# Patient Record
Sex: Male | Born: 1985 | Race: White | Hispanic: No | Marital: Married | Smoking: Former smoker
Health system: Southern US, Community
[De-identification: ages and names within clinical notes are randomized; demographics above are authoritative.]

## PROBLEM LIST (undated history)

## (undated) DIAGNOSIS — F419 Anxiety disorder, unspecified: Secondary | ICD-10-CM

## (undated) DIAGNOSIS — K219 Gastro-esophageal reflux disease without esophagitis: Secondary | ICD-10-CM

## (undated) DIAGNOSIS — I10 Essential (primary) hypertension: Secondary | ICD-10-CM

## (undated) HISTORY — PX: HERNIA REPAIR: SHX51

---

## 2017-03-05 ENCOUNTER — Emergency Department
Admission: EM | Admit: 2017-03-05 | Discharge: 2017-03-05 | Disposition: A | Discharge status: Home / Self Care | Payer: Self-pay | Attending: Emergency Medicine | Admitting: Emergency Medicine

## 2017-03-05 ENCOUNTER — Emergency Department: Payer: Self-pay

## 2017-03-05 DIAGNOSIS — I10 Essential (primary) hypertension: Secondary | ICD-10-CM | POA: Insufficient documentation

## 2017-03-05 DIAGNOSIS — F419 Anxiety disorder, unspecified: Secondary | ICD-10-CM | POA: Insufficient documentation

## 2017-03-05 DIAGNOSIS — Z79899 Other long term (current) drug therapy: Secondary | ICD-10-CM | POA: Insufficient documentation

## 2017-03-05 DIAGNOSIS — Z87891 Personal history of nicotine dependence: Secondary | ICD-10-CM | POA: Insufficient documentation

## 2017-03-05 DIAGNOSIS — E876 Hypokalemia: Secondary | ICD-10-CM | POA: Insufficient documentation

## 2017-03-05 HISTORY — DX: Gastro-esophageal reflux disease without esophagitis: K21.9

## 2017-03-05 HISTORY — DX: Essential (primary) hypertension: I10

## 2017-03-05 HISTORY — DX: Anxiety disorder, unspecified: F41.9

## 2017-03-05 LAB — CBC
HCT: 36.1 % — ABNORMAL LOW (ref 40.0–52.0)
HEMOGLOBIN: 12.8 g/dL — AB (ref 13.0–18.0)
MCH: 30.2 pg (ref 26.0–34.0)
MCHC: 35.5 g/dL (ref 32.0–36.0)
MCV: 85.1 fL (ref 80.0–100.0)
Platelets: 360 10*3/uL (ref 150–440)
RBC: 4.25 MIL/uL — AB (ref 4.40–5.90)
RDW: 12.9 % (ref 11.5–14.5)
WBC: 10 10*3/uL (ref 3.8–10.6)

## 2017-03-05 LAB — BASIC METABOLIC PANEL
ANION GAP: 9 (ref 5–15)
BUN: 9 mg/dL (ref 6–20)
CALCIUM: 9.1 mg/dL (ref 8.9–10.3)
CO2: 19 mmol/L — AB (ref 22–32)
Chloride: 111 mmol/L (ref 101–111)
Creatinine, Ser: 1.05 mg/dL (ref 0.61–1.24)
GLUCOSE: 95 mg/dL (ref 65–99)
Potassium: 2.8 mmol/L — ABNORMAL LOW (ref 3.5–5.1)
Sodium: 139 mmol/L (ref 135–145)

## 2017-03-05 LAB — FIBRIN DERIVATIVES D-DIMER (ARMC ONLY): FIBRIN DERIVATIVES D-DIMER (ARMC): 250.33 (ref 0.00–499.00)

## 2017-03-05 LAB — TROPONIN I

## 2017-03-05 MED ORDER — LORAZEPAM 1 MG PO TABS: 1.0000 mg | ORAL_TABLET | Freq: Once | ORAL | Status: DC

## 2017-03-05 MED ORDER — POTASSIUM CHLORIDE CRYS ER 20 MEQ PO TBCR
40.0000 meq | EXTENDED_RELEASE_TABLET | Freq: Once | ORAL | Status: AC
  Administered 2017-03-05: 40 meq via ORAL
  Filled 2017-03-05: qty 2

## 2017-03-05 NOTE — ED Triage Notes (Signed)
Patient reports general malaise beginning at 1pm on 03/04/17. Patient reports recent diagnosis of anxiety. Pt c/o hypertension.   Patient has visible work of breathing, which patient reports is normal for him

## 2017-03-05 NOTE — Discharge Instructions (Signed)
Keep a log of your blood pressure readings morning, noon and evening. Take these to your doctor next week. Continue lisinopril as directed by your doctor. Return to the ER for worsening symptoms, persistent vomiting, difficulty breathing or other concerns.

## 2017-03-05 NOTE — ED Notes (Signed)
Patient transported to X-ray 

## 2017-03-05 NOTE — ED Provider Notes (Signed)
[facility] Emergency Department Provider Note   ____________________________________________   First MD Initiated Contact with Patient 03/05/17 0125     (approximate)  I have reviewed the triage vital signs and the nursing notes.   HISTORY  Chief Complaint Hypertension    HPI Gabriel Ryan is a 31 y.o. male brought to the ED via EMS with a chief complaint of anxiety and hypertension. 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 Taking BuSpar. History of hypertension, taking lisinopril 40 mg daily. Reports generalized malaise 1 day. Repeatedly took his blood pressure and states it is high. Baseline blood pressure 130s/60s. Denies associated headache, chest pain, shortness of breath, abdominal pain, nausea, vomiting, diarrhea. Patient appears to have visible work of breathing which he states has been ongoing since being diagnosed with anxiety last week. He does not feel short of breath and his breathing does not bother him.   Past Medical History:  Diagnosis Date  . Anxiety   . GERD (gastroesophageal reflux disease)   . Hypertension     There are no active problems to display for this patient.   Past Surgical History:  Procedure Laterality Date  . HERNIA REPAIR      Prior to Admission medications   Medication Sig Start Date End Date Taking? Authorizing Provider  busPIRone (BUSPAR) 15 MG tablet Take 15 mg by mouth 3 (three) times daily.   Yes Historical Provider, MD  lisinopril (PRINIVIL,ZESTRIL) 40 MG tablet Take 40 mg by mouth daily.   Yes Historical Provider, MD  metoprolol succinate (TOPROL-XL) 25 MG 24 hr tablet Take 25 mg by mouth daily.   Yes Historical Provider, MD  ranitidine (ZANTAC) 150 MG tablet Take 150 mg by mouth 2 (two) times daily.   Yes Historical Provider, MD    Allergies Patient has no known allergies.  Family History  Problem Relation Age of Onset  . Anxiety  disorder Mother   . Hypertension Father   . Diabetes Father   . Hypertension Sister     Social History Social History  Substance Use Topics  . Smoking status: Former Games developer  . Smokeless tobacco: Never Used  . Alcohol use No    Review of Systems  Constitutional: Positive for generalized malaise. No fever/chills. Eyes: No visual changes. ENT: No sore throat. Cardiovascular: Denies chest pain. Respiratory: Denies shortness of breath. Gastrointestinal: No abdominal pain.  No nausea, no vomiting.  No diarrhea.  No constipation. Genitourinary: Negative for dysuria. Musculoskeletal: Negative for back pain. Skin: Negative for rash. Neurological: Negative for headaches, focal weakness or numbness. Psychiatric:Positive for anxiety.  10-point ROS otherwise negative.  ____________________________________________   PHYSICAL EXAM:  VITAL SIGNS: ED Triage Vitals  Enc Vitals Group     BP 03/05/17 0116 (!) 162/98     Pulse Rate 03/05/17 0116 82     Resp 03/05/17 0116 20     Temp 03/05/17 0116 98 F (36.7 C)     Temp Source 03/05/17 0116 Oral     SpO2 03/05/17 0115 100 %     Weight 03/05/17 0116 245 lb (111.1 kg)     Height 03/05/17 0116  (1.727 m)     Head Circumference --      Peak Flow --      Pain Score --      Pain Loc --      Pain Edu? --      Excl. in GC? --  Constitutional: Alert and oriented. Well appearing and in no acute distress. Eyes: Conjunctivae are normal. PERRL. EOMI. Head: Atraumatic. Nose: No congestion/rhinnorhea. Mouth/Throat: Mucous membranes are moist.  Oropharynx non-erythematous. Neck: No stridor.   Cardiovascular: Normal rate, regular rhythm. Grossly normal heart sounds.  Good peripheral circulation. Respiratory: Normal respiratory effort.  No retractions. Lungs CTAB. Breathing appears labored. Gastrointestinal: Soft and nontender. No distention. No abdominal bruits. No CVA tenderness. Musculoskeletal: No lower extremity tenderness nor  edema.  No joint effusions. Neurologic:  Normal speech and language. No gross focal neurologic deficits are appreciated. No gait instability. Skin:  Skin is warm, dry and intact. No rash noted. Psychiatric: Mood and affect are normal. Speech and behavior are normal.  ____________________________________________   LABS (all labs ordered are listed, but only abnormal results are displayed)  Labs Reviewed  BASIC METABOLIC PANEL - Abnormal; Notable for the following:       Result Value   Potassium 2.8 (*)    CO2 19 (*)    All other components within normal limits  CBC - Abnormal; Notable for the following:    RBC 4.25 (*)    Hemoglobin 12.8 (*)    HCT 36.1 (*)    All other components within normal limits  TROPONIN I  FIBRIN DERIVATIVES D-DIMER ([facility] ONLY)   ____________________________________________  EKG  ED ECG REPORT I, Zailah Zagami J, the attending physician, personally viewed and interpreted this ECG.   Date: 03/05/2017  EKG Time: 0123  Rate: 79  Rhythm: normal EKG, normal sinus rhythm  Axis: Normal  Intervals:none  ST&T Change: Nonspecific  ____________________________________________  RADIOLOGY  Chest x-ray interpreted per Dr. Harrie Jeans: No active cardiopulmonary disease. ____________________________________________   PROCEDURES  Procedure(s) performed: None  Procedures  Critical Care performed: No  ____________________________________________   INITIAL IMPRESSION / ASSESSMENT AND PLAN / ED COURSE  Pertinent labs & imaging results that were available during my care of the patient were reviewed by me and considered in my medical decision making (see chart for details).  31 year old male who presents with elevated blood pressure. Recently diagnosed with anxiety. Blood pressure currently 130s/90s. Discussed with patient we will monitor blood pressure for now as he is asymptomatic. 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  Clinical Course as of Mar 05 617  Fri Mar 05, 2017  8119 Updated patient of laboratory and imaging results. Blood pressure 132/91 prior to my arrival to patient's treatment room. After our conversation his blood pressure is elevated and he is anxious. Patient will be returning home tomorrow. He is not driving for the next several hours and is requesting something for anxiety. Will give  PO Ativan now. Strict return precautions given. Patient verbalizes understanding and agrees with plan of care.  [JS]  0617 Ativan was not given because patient told the nurse that he needs to start driving at 1:47 AM to return home.  [JS]    Clinical Course User Index [JS] Irean Hong, MD     ____________________________________________   FINAL CLINICAL IMPRESSION(S) / ED DIAGNOSES  Final diagnoses:  Essential hypertension  Anxiety  Hypokalemia      NEW MEDICATIONS STARTED DURING THIS VISIT:  Discharge Medication List as of 03/05/2017  4:09 AM       Note:  This document was prepared using Dragon voice recognition software and may include unintentional dictation errors.    Irean Hong, MD 03/05/17 [phone]

## 2018-01-20 IMAGING — CR DG CHEST 2V
2 series · 2 of 2 positions shown · non-contrast
Comparison: None.

CLINICAL DATA: 30 y/o  M; hypertension.

EXAM:
CHEST  2 VIEW

[chest pa]
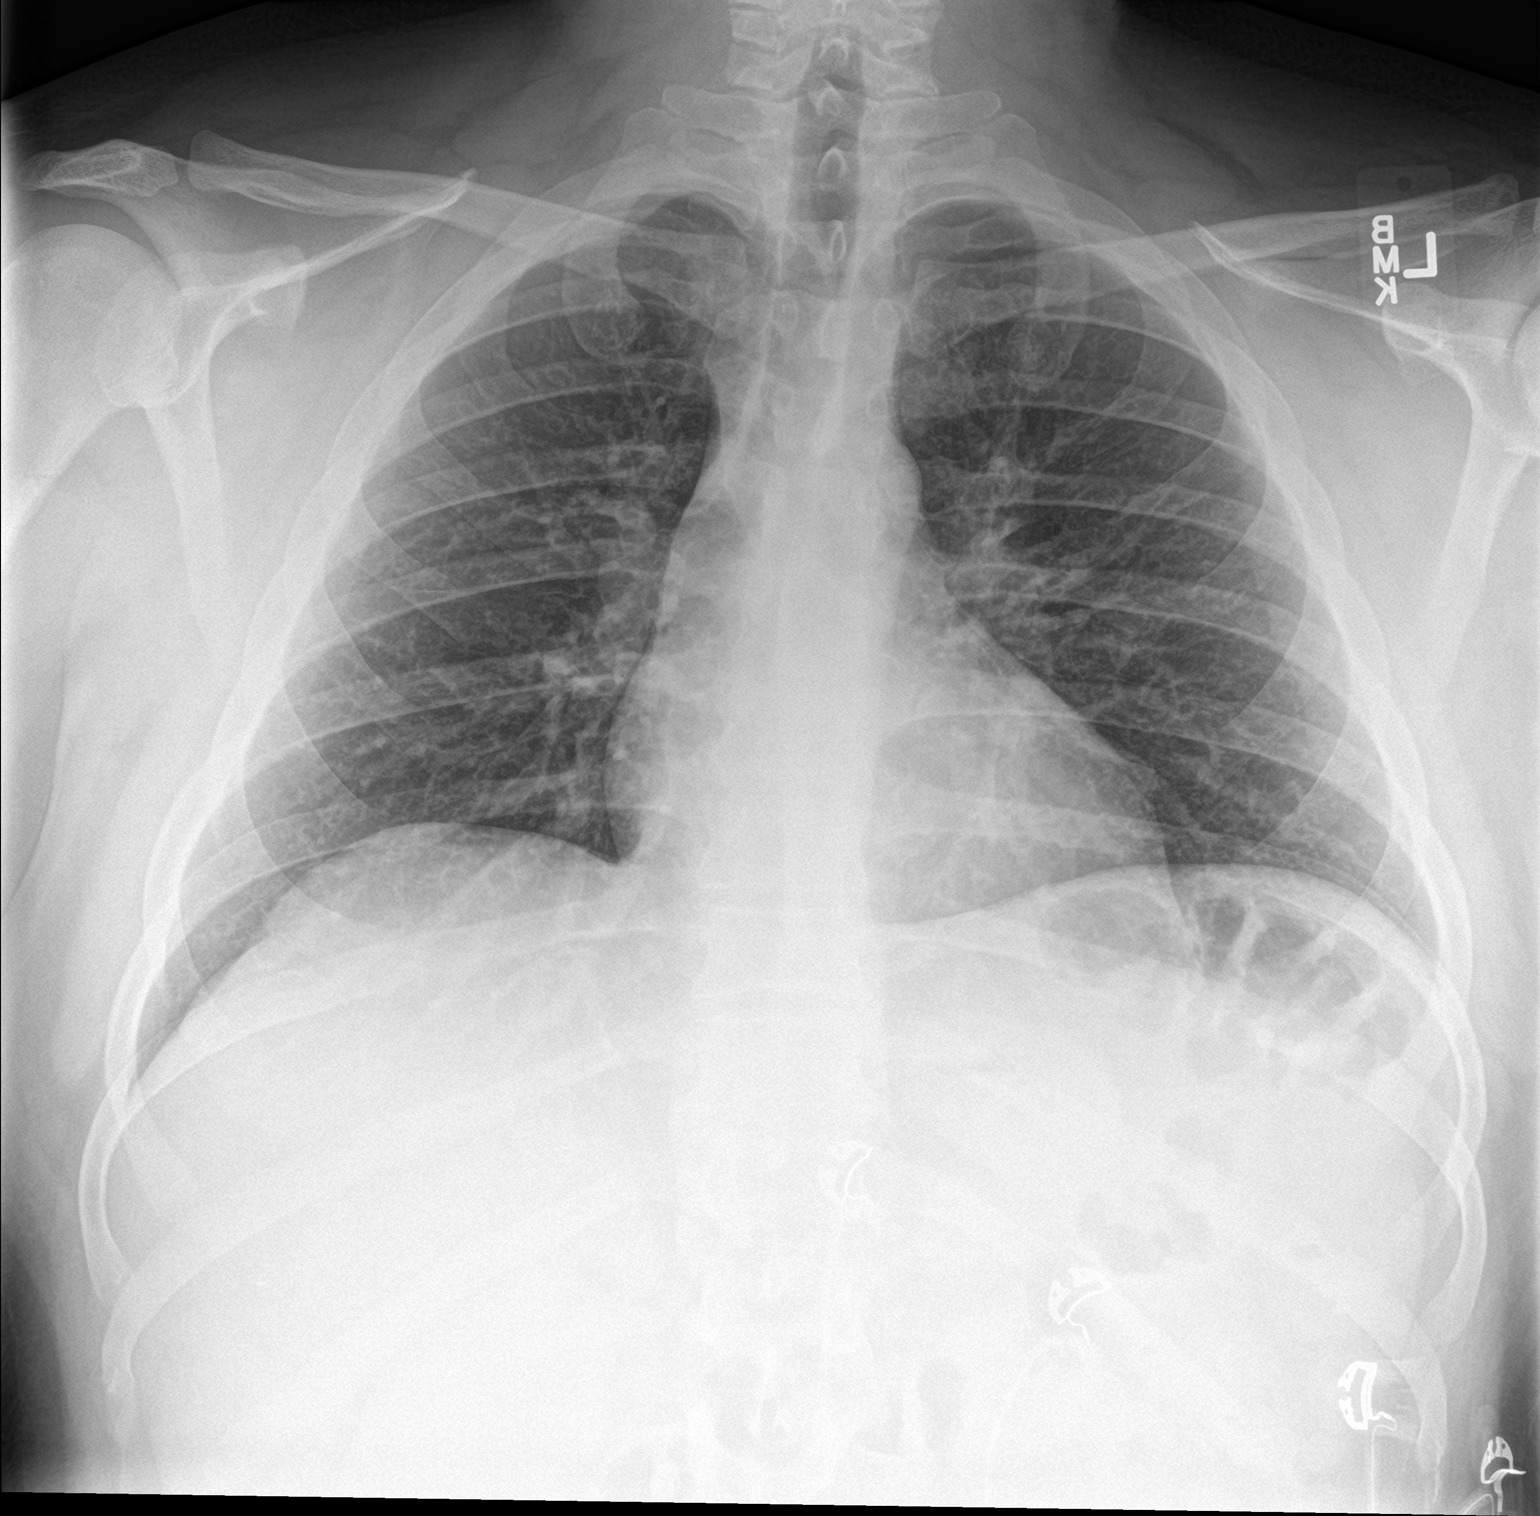

[chest lat]
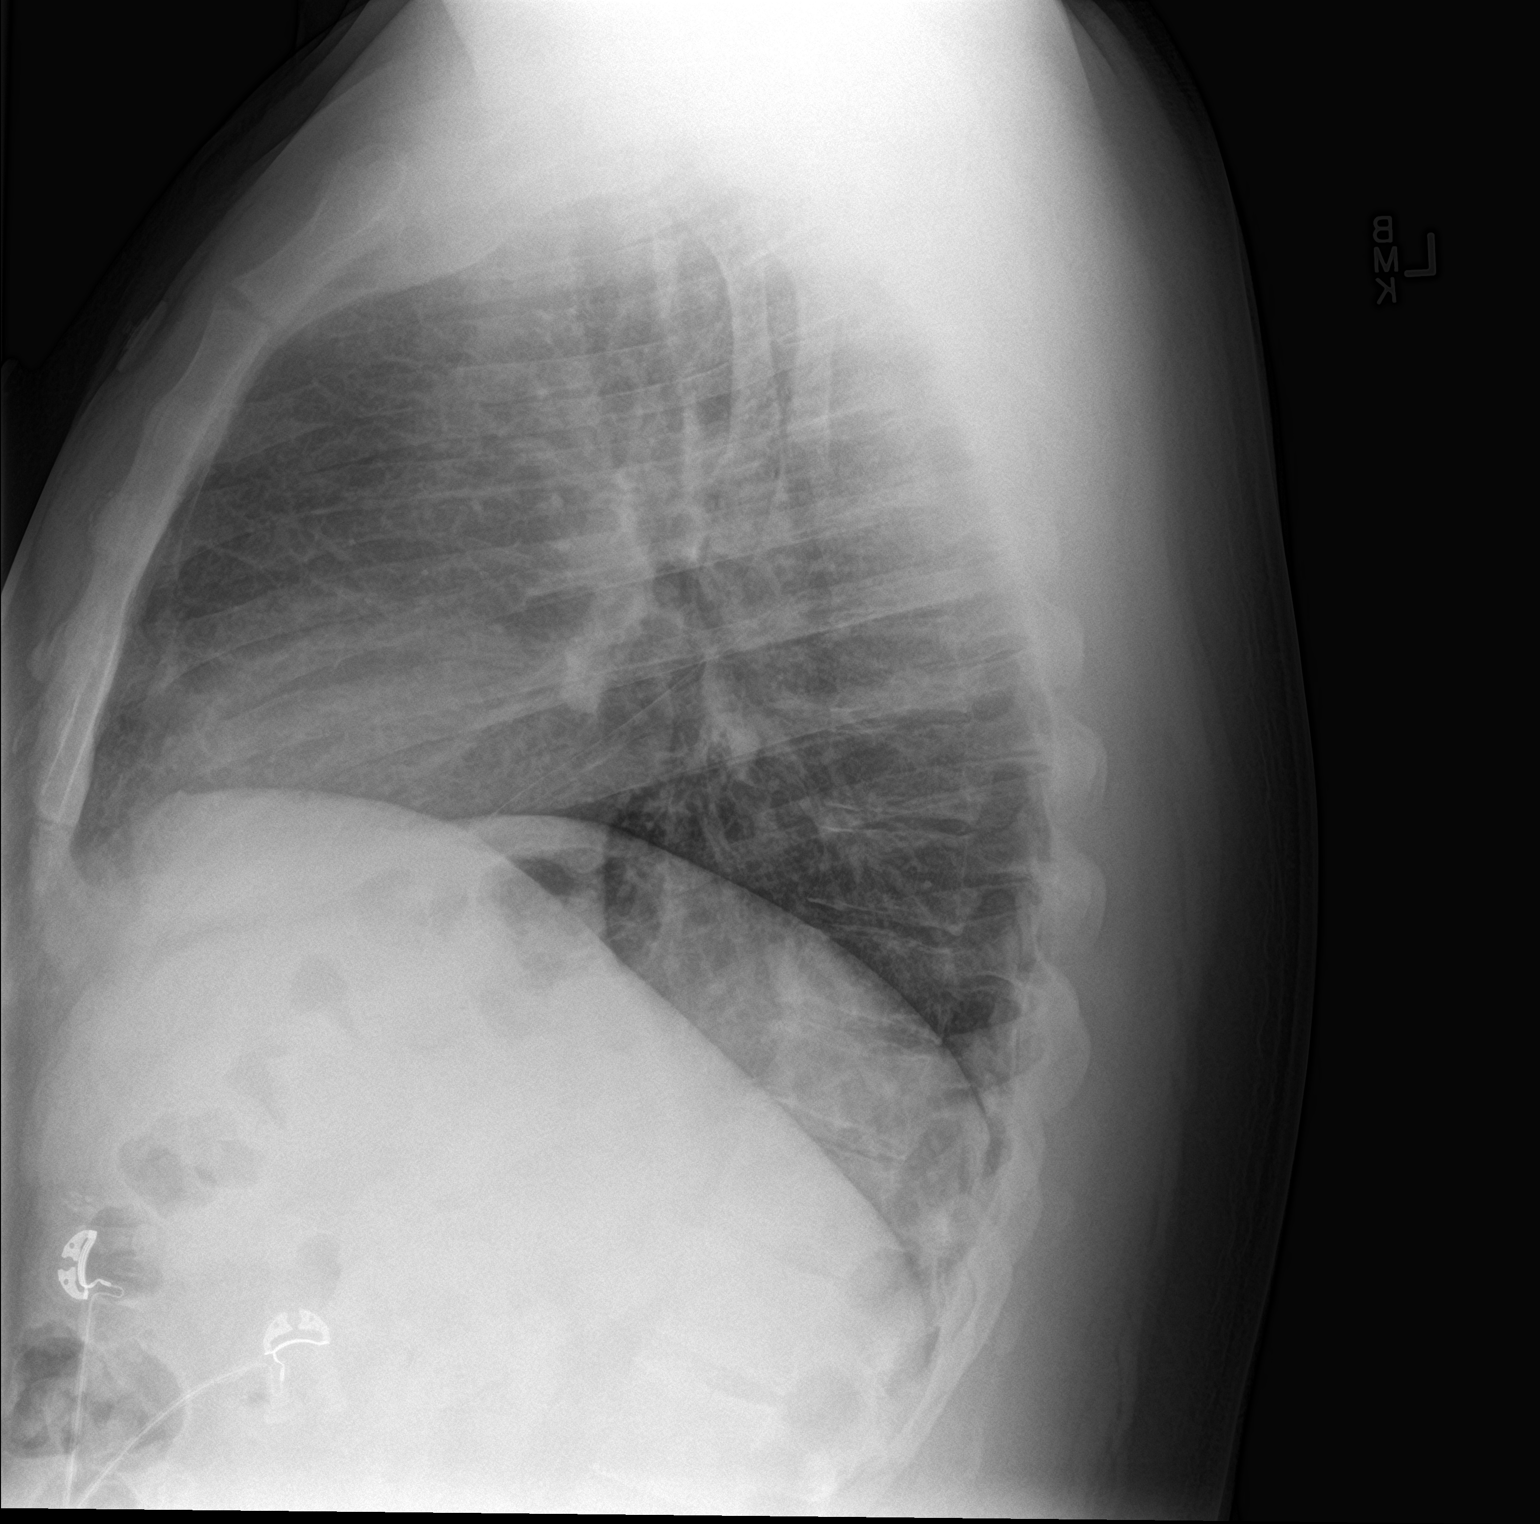

[2 of 2 positions shown; findings below may reference images not displayed]

FINDINGS: The heart size and mediastinal contours are within normal limits.
Both lungs are clear. The visualized skeletal structures are
unremarkable.
IMPRESSION: No active cardiopulmonary disease.

By: Diaz Deda M.D.
